# Patient Record
Sex: Female | Born: 1990 | Race: White | Hispanic: No | Marital: Single | Smoking: Never smoker
Health system: Southern US, Community
[De-identification: ages and names within clinical notes are randomized; demographics above are authoritative.]

## PROBLEM LIST (undated history)

## (undated) DIAGNOSIS — S43006A Unspecified dislocation of unspecified shoulder joint, initial encounter: Secondary | ICD-10-CM

## (undated) DIAGNOSIS — S43005A Unspecified dislocation of left shoulder joint, initial encounter: Secondary | ICD-10-CM

## (undated) HISTORY — PX: TYMPANOSTOMY TUBE PLACEMENT: SHX32

## (undated) HISTORY — PX: ADENOIDECTOMY: SUR15

## (undated) HISTORY — PX: SHOULDER SURGERY: SHX246

---
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
## 2009-03-08 ENCOUNTER — Ambulatory Visit: Payer: Self-pay | Admitting: Radiology

## 2009-03-08 ENCOUNTER — Emergency Department (HOSPITAL_BASED_OUTPATIENT_CLINIC_OR_DEPARTMENT_OTHER): Admission: EM | Admit: 2009-03-08 | Discharge: 2009-03-08 | Payer: Self-pay | Admitting: Emergency Medicine

## 2009-04-12 ENCOUNTER — Emergency Department (HOSPITAL_COMMUNITY): Admission: EM | Admit: 2009-04-12 | Discharge: 2009-04-12 | Payer: Self-pay | Admitting: Emergency Medicine

## 2012-03-05 ENCOUNTER — Encounter (HOSPITAL_BASED_OUTPATIENT_CLINIC_OR_DEPARTMENT_OTHER): Payer: Self-pay | Admitting: *Deleted

## 2012-03-05 ENCOUNTER — Emergency Department (INDEPENDENT_AMBULATORY_CARE_PROVIDER_SITE_OTHER): Payer: BC Managed Care – PPO

## 2012-03-05 ENCOUNTER — Emergency Department (HOSPITAL_BASED_OUTPATIENT_CLINIC_OR_DEPARTMENT_OTHER)
Admission: EM | Admit: 2012-03-05 | Discharge: 2012-03-05 | Disposition: A | Discharge status: Home / Self Care | Payer: BC Managed Care – PPO | Attending: Emergency Medicine | Admitting: Emergency Medicine

## 2012-03-05 DIAGNOSIS — Z09 Encounter for follow-up examination after completed treatment for conditions other than malignant neoplasm: Secondary | ICD-10-CM

## 2012-03-05 DIAGNOSIS — X58XXXA Exposure to other specified factors, initial encounter: Secondary | ICD-10-CM

## 2012-03-05 DIAGNOSIS — S43016A Anterior dislocation of unspecified humerus, initial encounter: Secondary | ICD-10-CM | POA: Insufficient documentation

## 2012-03-05 DIAGNOSIS — S43015A Anterior dislocation of left humerus, initial encounter: Secondary | ICD-10-CM

## 2012-03-05 DIAGNOSIS — Y93B3 Activity, free weights: Secondary | ICD-10-CM | POA: Insufficient documentation

## 2012-03-05 DIAGNOSIS — X503XXA Overexertion from repetitive movements, initial encounter: Secondary | ICD-10-CM | POA: Insufficient documentation

## 2012-03-05 HISTORY — DX: Unspecified dislocation of unspecified shoulder joint, initial encounter: S43.006A

## 2012-03-05 MED ORDER — FENTANYL CITRATE 0.05 MG/ML IJ SOLN
INTRAMUSCULAR | Status: AC
  Filled 2012-03-05: qty 2

## 2012-03-05 MED ORDER — OXYCODONE-ACETAMINOPHEN 5-325 MG PO TABS: 2.0000 | ORAL_TABLET | Freq: Four times a day (QID) | ORAL | Status: AC | PRN

## 2012-03-05 MED ORDER — ONDANSETRON HCL 4 MG/2ML IJ SOLN
INTRAMUSCULAR | Status: AC
  Administered 2012-03-05: 4 mg
  Filled 2012-03-05: qty 2

## 2012-03-05 MED ORDER — PROPOFOL 10 MG/ML IV BOLUS
60.0000 mg | Freq: Once | INTRAVENOUS | Status: AC
  Administered 2012-03-05: 30 mg via INTRAVENOUS
  Filled 2012-03-05: qty 6

## 2012-03-05 MED ORDER — MIDAZOLAM HCL 2 MG/2ML IJ SOLN
INTRAMUSCULAR | Status: AC
  Filled 2012-03-05: qty 2

## 2012-03-05 MED ORDER — HYDROMORPHONE HCL PF 1 MG/ML IJ SOLN
INTRAMUSCULAR | Status: AC
  Administered 2012-03-05: 1 mg
  Filled 2012-03-05: qty 1

## 2012-03-05 MED ORDER — IBUPROFEN 800 MG PO TABS: 800.0000 mg | ORAL_TABLET | Freq: Three times a day (TID) | ORAL | Status: AC

## 2012-03-05 MED ORDER — PROPOFOL 10 MG/ML IV EMUL
INTRAVENOUS | Status: AC
  Administered 2012-03-05: 30 mg via INTRAVENOUS
  Filled 2012-03-05: qty 20

## 2012-03-05 NOTE — Discharge Instructions (Signed)

## 2012-03-05 NOTE — ED Notes (Signed)
1mg   Midazolam &  75 mcg fentanyl given per MD order , patient alert prior to administration, VSS, scanner not functioning

## 2012-03-05 NOTE — ED Notes (Signed)
Additional 30 mg propofol given per MD verbal order, patient awake and alert prior to med, scanner not working

## 2012-03-05 NOTE — ED Notes (Signed)
50mg  propofol given per MD order, VSS, patient responding to questions, scanner not available

## 2012-03-05 NOTE — ED Notes (Signed)
75 mcg fentanyl given per MD order, VSS, scanner not functioning

## 2012-03-05 NOTE — ED Notes (Signed)
1mg  versed given per MD order, vss, scanner not available

## 2012-03-05 NOTE — ED Notes (Signed)
30 mg propofol given per MD order, scanner not functioning, patient alert prior to med administration, VSS

## 2012-03-05 NOTE — ED Notes (Signed)
1mg  midazolam given per MD order, scanner not functioning, vss

## 2012-03-05 NOTE — ED Notes (Signed)
MD procedure for shoulder complete

## 2012-03-05 NOTE — ED Notes (Signed)
Additional 30mg  propofol given per MD order, scanner not functioning, patient alert and oriented prior to administration

## 2012-03-05 NOTE — ED Notes (Signed)
Additional 30mg  propofol given per MD order, scanner not available, VSS, patient alert prior to administration

## 2012-03-05 NOTE — ED Notes (Signed)
Patient on room air, no respiratory distress

## 2012-03-05 NOTE — ED Provider Notes (Signed)
History     CSN: [id number]  Arrival date & time 03/05/12  1241   First MD Initiated Contact with Patient 03/05/12 1246      Chief Complaint  Patient presents with  . 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 Patient has obvious deformity of the left shoulder similar to when she's had prior shoulder dislocations. Patient has always been able to be reduced in the emergency department. She denies any other injuries. Any movement or palpation makes the pain worse. She has no neurologic symptoms in the left upper [place] is neurovascularly intact.There are no other associated or modifying factors.  Past Medical History  Diagnosis Date  . Shoulder dislocation     bilateral    Past Surgical History  Procedure Date  . Shoulder surgery     2008  . Tonsillectomy and adenoidectomy   . Tympanostomy tube placement     History reviewed. No pertinent family history.  History  Substance Use Topics  . Smoking status: Never Smoker   . Smokeless tobacco: Not on file  . Alcohol Use: No    OB History    Grav Para Term Preterm Abortions TAB SAB Ect Mult Living                  Review of Systems  Constitutional: Negative.   HENT: Negative.   Eyes: Negative.   Respiratory: Negative.   Cardiovascular: Negative.   Gastrointestinal: Negative.   Genitourinary: Negative.   Musculoskeletal:       Left shoulder pain  Skin: Negative.   Neurological: Negative.   Hematological: Negative.   Psychiatric/Behavioral: Negative.   All other systems reviewed and are negative.    Allergies  Review of patient's allergies indicates not on file.  Home Medications  No current outpatient prescriptions on file.  There were no vitals taken for  this visit.  Physical Exam  Nursing note and vitals reviewed. GEN: Well-developed, well-nourished female in significant distress due to pain HEENT: Atraumatic, normocephalic. Oropharynx clear without erythema EYES: PERRLA BL, no scleral icterus. NECK: Trachea midline, no meningismus CV: Tachycardic with regular rhythm. No murmurs, rubs, or gallops PULM: No respiratory distress.  No crackles, wheezes, or rales. GI: soft, non-tender. No guarding, rebound, or tenderness. + bowel sounds  GU: deferred Neuro: cranial nerves 2-12 intact, no abnormalities of strength or sensation, A and O x 3 MSK: Left upper extremity for bracing of the body. There is deformity of the contours of the left shoulder consistent with anterior shoulder dislocation. Left upper extremity is neurovascularly intact with 2+ radial pulse.  Skin: No rashes petechiae, purpura, or jaundice Psych: no abnormality of mood   ED Course  Reduction of dislocation Performed by: Cyndra Numbers Authorized by: Cyndra Numbers Consent: Verbal consent obtained. Written consent obtained. Risks and benefits: risks, benefits and alternatives were discussed Consent given by: patient Patient understanding: patient states understanding of the procedure being performed Patient consent: the patient's understanding of the procedure matches consent given Procedure consent: procedure consent matches procedure scheduled Relevant documents: relevant documents present and verified Test results: test results available and properly labeled Site marked: the operative site was marked Imaging studies: imaging studies available Required items: required blood products, implants, devices, and special equipment available Patient identity confirmed: verbally with patient and arm band Time out:  Immediately prior to procedure a "time out" was called to verify the correct patient, procedure, equipment, support staff and site/side marked as required. Local  anesthesia used: no Patient sedated: yes Sedation type: moderate (conscious) sedation Sedatives: see MAR for details Patient tolerance: Patient tolerated the procedure well with no immediate complications. Comments: Left shoulder anterior dislocation reduction was performed   (including critical care time)  Procedural sedation Performed by: Cyndra Numbers Consent: Verbal consent obtained. Risks and benefits: risks, benefits and alternatives were discussed Required items: required blood products, implants, devices, and special equipment available Patient identity confirmed: arm band and provided demographic data Time out: Immediately prior to procedure a "time out" was called to verify the correct patient, procedure, equipment, support staff and site/side marked as required.  Sedation type: moderate (conscious) sedation NPO time confirmed and considedered  Sedatives: PROPOFOL  Physician Time at Bedside: 30  Of note, the patient did require additional medications including for milligrams of Versed, and 200 mcg of sentinel. Patient was conscious throughout the procedure but was able to tolerate shoulder reduction without significant discomfort  Vitals: Vital signs were monitored during sedation. Cardiac Monitor, pulse oximeter Patient tolerance: Patient tolerated the procedure well with no immediate complications. Comments: Pt with uneventful recovered. Returned to pre-procedural sedation baseline    Labs Reviewed - No data to display Dg Shoulder Left  03/05/2012  *RADIOLOGY REPORT*  Clinical Data: Probable left shoulder dislocation  LEFT SHOULDER - 2+ VIEW  Comparison: report 05/29/2003 no images available.  Findings: Two views of the left shoulder submitted.  There is anterior inferior subluxation of the left humeral head from glenohumeral joint.  IMPRESSION: Anterior inferior left shoulder dislocation.  Original Report Authenticated By: Natasha Mead, M.D.   Dg Shoulder Left  Port  03/05/2012  *RADIOLOGY REPORT*  Clinical Data: Status post reduction of dislocation.  PORTABLE LEFT SHOULDER - 2+ VIEW  Comparison: Plain films earlier the same date.  Findings: Anterior dislocation has been reduced.  No new abnormality.  IMPRESSION: Successful reduction of dislocation.  Original Report Authenticated By: Bernadene Bell. D'ALESSIO, M.D.     1. Dislocation of shoulder, anterior, left, closed       MDM  Patient presented with obvious left shoulder dislocation. Initial film was performed with no evidence of fracture. Conscious sedation and reduction were performed successfully. Please see above notes for details. Patient returned to baseline within 30 minutes. She was able to be discharged. She will followup with her prior orthopedist. Patient was discharged in sling in good condition.Marland Kitchen She was given prescriptions for pain medications and precautions for reasons to return.        Cyndra Numbers, MD 03/05/12 1757

## 2012-03-05 NOTE — ED Notes (Signed)
Patient placed on 2l [place] oxygen at start of procedure

## 2012-03-05 NOTE — ED Notes (Signed)
Working out at gym dislocated left shoulder states has history of same several times

## 2012-03-05 NOTE — ED Notes (Signed)
1mg  versed given per MD order, VSS, scanner not available

## 2012-03-09 ENCOUNTER — Other Ambulatory Visit: Payer: Self-pay | Admitting: Orthopedic Surgery

## 2012-03-09 DIAGNOSIS — M25512 Pain in left shoulder: Secondary | ICD-10-CM

## 2012-03-16 ENCOUNTER — Ambulatory Visit
Admission: RE | Admit: 2012-03-16 | Discharge: 2012-03-16 | Disposition: A | Discharge status: Home / Self Care | Payer: BC Managed Care – PPO | Source: Ambulatory Visit | Attending: Orthopedic Surgery | Admitting: Orthopedic Surgery

## 2012-03-16 DIAGNOSIS — M25512 Pain in left shoulder: Secondary | ICD-10-CM

## 2012-03-16 MED ORDER — IOHEXOL 180 MG/ML  SOLN
5.0000 mL | Freq: Once | INTRAMUSCULAR | Status: AC | PRN
  Administered 2012-03-16: 5 mL via INTRA_ARTICULAR

## 2012-03-20 ENCOUNTER — Other Ambulatory Visit: Payer: Self-pay | Admitting: Orthopedic Surgery

## 2012-03-23 ENCOUNTER — Encounter (HOSPITAL_BASED_OUTPATIENT_CLINIC_OR_DEPARTMENT_OTHER): Payer: Self-pay | Admitting: *Deleted

## 2012-03-23 NOTE — Progress Notes (Signed)
Bring all medications

## 2012-03-27 ENCOUNTER — Encounter (HOSPITAL_BASED_OUTPATIENT_CLINIC_OR_DEPARTMENT_OTHER): Payer: Self-pay | Admitting: Orthopedic Surgery

## 2012-03-27 ENCOUNTER — Encounter (HOSPITAL_BASED_OUTPATIENT_CLINIC_OR_DEPARTMENT_OTHER): Payer: Self-pay | Admitting: Certified Registered Nurse Anesthetist

## 2012-03-27 ENCOUNTER — Encounter (HOSPITAL_BASED_OUTPATIENT_CLINIC_OR_DEPARTMENT_OTHER)
Admission: RE | Disposition: A | Discharge status: Home / Self Care | Payer: Self-pay | Source: Ambulatory Visit | Attending: Orthopedic Surgery

## 2012-03-27 ENCOUNTER — Ambulatory Visit (HOSPITAL_BASED_OUTPATIENT_CLINIC_OR_DEPARTMENT_OTHER): Payer: BC Managed Care – PPO | Admitting: Certified Registered Nurse Anesthetist

## 2012-03-27 ENCOUNTER — Encounter (HOSPITAL_BASED_OUTPATIENT_CLINIC_OR_DEPARTMENT_OTHER): Payer: Self-pay

## 2012-03-27 ENCOUNTER — Ambulatory Visit (HOSPITAL_BASED_OUTPATIENT_CLINIC_OR_DEPARTMENT_OTHER)
Admission: RE | Admit: 2012-03-27 | Discharge: 2012-03-27 | Disposition: A | Discharge status: Home / Self Care | Payer: BC Managed Care – PPO | Source: Ambulatory Visit | Attending: Orthopedic Surgery | Admitting: Orthopedic Surgery

## 2012-03-27 DIAGNOSIS — S43005A Unspecified dislocation of left shoulder joint, initial encounter: Secondary | ICD-10-CM | POA: Diagnosis present

## 2012-03-27 DIAGNOSIS — M24419 Recurrent dislocation, unspecified shoulder: Secondary | ICD-10-CM | POA: Insufficient documentation

## 2012-03-27 HISTORY — DX: Unspecified dislocation of left shoulder joint, initial encounter: S43.005A

## 2012-03-27 SURGERY — SHOULDER ARTHROSCOPY WITH BANKART REPAIR
Anesthesia: General | Site: Shoulder | Laterality: Left | Wound class: Clean

## 2012-03-27 MED ORDER — METHOCARBAMOL 500 MG PO TABS
500.0000 mg | ORAL_TABLET | Freq: Once | ORAL | Status: AC | PRN
  Administered 2012-03-27: 500 mg via ORAL

## 2012-03-27 MED ORDER — OXYCODONE-ACETAMINOPHEN 5-325 MG PO TABS
1.0000 | ORAL_TABLET | Freq: Once | ORAL | Status: AC | PRN
  Administered 2012-03-27: 2 via ORAL

## 2012-03-27 MED ORDER — METOCLOPRAMIDE HCL 5 MG/ML IJ SOLN: 10.0000 mg | Freq: Once | INTRAMUSCULAR | Status: DC | PRN

## 2012-03-27 MED ORDER — MIDAZOLAM HCL 2 MG/2ML IJ SOLN
0.5000 mg | INTRAMUSCULAR | Status: DC | PRN
  Administered 2012-03-27: 2 mg via INTRAVENOUS

## 2012-03-27 MED ORDER — PROMETHAZINE HCL 25 MG PO TABS
25.0000 mg | ORAL_TABLET | Freq: Once | ORAL | Status: AC | PRN
  Administered 2012-03-27: 25 mg via ORAL

## 2012-03-27 MED ORDER — LIDOCAINE HCL (CARDIAC) 20 MG/ML IV SOLN
INTRAVENOUS | Status: DC | PRN
  Administered 2012-03-27: 50 mg via INTRAVENOUS

## 2012-03-27 MED ORDER — HYDROMORPHONE HCL PF 1 MG/ML IJ SOLN
0.2500 mg | INTRAMUSCULAR | Status: DC | PRN
  Administered 2012-03-27 (×3): 0.5 mg via INTRAVENOUS

## 2012-03-27 MED ORDER — SODIUM CHLORIDE 0.9 % IR SOLN
Status: DC | PRN
  Administered 2012-03-27: 12000 mL

## 2012-03-27 MED ORDER — FENTANYL CITRATE 0.05 MG/ML IJ SOLN
25.0000 ug | INTRAMUSCULAR | Status: DC | PRN
  Administered 2012-03-27: 25 ug via INTRAVENOUS

## 2012-03-27 MED ORDER — PROMETHAZINE HCL 25 MG PO TABS: 25.0000 mg | ORAL_TABLET | Freq: Four times a day (QID) | ORAL | Status: AC | PRN

## 2012-03-27 MED ORDER — PROPOFOL 10 MG/ML IV EMUL
INTRAVENOUS | Status: DC | PRN
  Administered 2012-03-27: 200 mg via INTRAVENOUS

## 2012-03-27 MED ORDER — MIDAZOLAM HCL 5 MG/5ML IJ SOLN
INTRAMUSCULAR | Status: DC | PRN
  Administered 2012-03-27: 1 mg via INTRAVENOUS

## 2012-03-27 MED ORDER — CEFAZOLIN SODIUM 1-5 GM-% IV SOLN
1.0000 g | INTRAVENOUS | Status: AC
  Administered 2012-03-27: 1 g via INTRAVENOUS

## 2012-03-27 MED ORDER — ROPIVACAINE HCL 5 MG/ML IJ SOLN
INTRAMUSCULAR | Status: DC | PRN
  Administered 2012-03-27: 15 mL via EPIDURAL

## 2012-03-27 MED ORDER — LIDOCAINE HCL 1 % IJ SOLN
INTRAMUSCULAR | Status: DC | PRN
  Administered 2012-03-27: 2 mL via INTRADERMAL

## 2012-03-27 MED ORDER — ONDANSETRON HCL 4 MG/2ML IJ SOLN
INTRAMUSCULAR | Status: DC | PRN
  Administered 2012-03-27: 4 mg via INTRAVENOUS

## 2012-03-27 MED ORDER — LACTATED RINGERS IV SOLN
INTRAVENOUS | Status: DC
  Administered 2012-03-27 (×3): via INTRAVENOUS

## 2012-03-27 MED ORDER — OXYCODONE-ACETAMINOPHEN 10-325 MG PO TABS: 1.0000 | ORAL_TABLET | Freq: Four times a day (QID) | ORAL | Status: AC | PRN

## 2012-03-27 MED ORDER — FENTANYL CITRATE 0.05 MG/ML IJ SOLN
50.0000 ug | INTRAMUSCULAR | Status: DC | PRN
  Administered 2012-03-27: 100 ug via INTRAVENOUS

## 2012-03-27 MED ORDER — DEXAMETHASONE SODIUM PHOSPHATE 4 MG/ML IJ SOLN
INTRAMUSCULAR | Status: DC | PRN
  Administered 2012-03-27: 10 mg via INTRAVENOUS

## 2012-03-27 MED ORDER — METHOCARBAMOL 500 MG PO TABS: 500.0000 mg | ORAL_TABLET | Freq: Four times a day (QID) | ORAL | Status: AC

## 2012-03-27 SURGICAL SUPPLY — 73 items
ANCHOR PUSHLOCK BIOCOMP 2.9X15 (Orthopedic Implant) ×4 IMPLANT
BENZOIN TINCTURE PRP APPL 2/3 (GAUZE/BANDAGES/DRESSINGS) ×2 IMPLANT
BLADE 4.2CUDA (BLADE) IMPLANT
BLADE CUDA GRT WHITE 3.5 (BLADE) IMPLANT
BLADE CUTTER GATOR 3.5 (BLADE) ×2 IMPLANT
BLADE GREAT WHITE 4.2 (BLADE) IMPLANT
BLADE SURG 15 STRL LF DISP TIS (BLADE) ×1 IMPLANT
BLADE SURG 15 STRL SS (BLADE) ×1
BUR OVAL 6.0 (BURR) IMPLANT
CANISTER OMNI JUG 16 LITER (MISCELLANEOUS) IMPLANT
CANNULA 5.75X71 LONG (CANNULA) ×2 IMPLANT
CANNULA ACUFLEX KIT 5X76 (CANNULA) IMPLANT
CANNULA TWIST IN 8.25X7CM (CANNULA) ×2 IMPLANT
CANNULA TWIST IN 8.25X9CM (CANNULA) IMPLANT
CLOTH BEACON ORANGE TIMEOUT ST (SAFETY) ×2 IMPLANT
CUTTER MENISCUS  4.2MM (BLADE)
CUTTER MENISCUS 4.2MM (BLADE) IMPLANT
DECANTER SPIKE VIAL GLASS SM (MISCELLANEOUS) IMPLANT
DRAPE INCISE IOBAN 66X45 STRL (DRAPES) ×2 IMPLANT
DRAPE SHOULDER BEACH CHAIR (DRAPES) ×2 IMPLANT
DRAPE U 20/CS (DRAPES) ×4 IMPLANT
DRAPE U-SHAPE 47X51 STRL (DRAPES) ×2 IMPLANT
DRAPE UTILITY W/TAPE 26X15 (DRAPES) ×2 IMPLANT
DRSG PAD ABDOMINAL 8X10 ST (GAUZE/BANDAGES/DRESSINGS) ×4 IMPLANT
DURAPREP 26ML APPLICATOR (WOUND CARE) ×2 IMPLANT
ELECT REM PT RETURN 9FT ADLT (ELECTROSURGICAL)
ELECTRODE REM PT RTRN 9FT ADLT (ELECTROSURGICAL) IMPLANT
FIBERSTICK 2 (SUTURE) ×4 IMPLANT
GLOVE BIO SURGEON STRL SZ 6.5 (GLOVE) ×2 IMPLANT
GLOVE BIO SURGEON STRL SZ7.5 (GLOVE) ×2 IMPLANT
GLOVE BIO SURGEON STRL SZ8 (GLOVE) ×2 IMPLANT
GLOVE BIOGEL PI IND STRL 7.0 (GLOVE) ×1 IMPLANT
GLOVE BIOGEL PI IND STRL 7.5 (GLOVE) ×1 IMPLANT
GLOVE BIOGEL PI IND STRL 8 (GLOVE) ×2 IMPLANT
GLOVE BIOGEL PI INDICATOR 7.0 (GLOVE) ×1
GLOVE BIOGEL PI INDICATOR 7.5 (GLOVE) ×1
GLOVE BIOGEL PI INDICATOR 8 (GLOVE) ×2
GLOVE ORTHO TXT STRL SZ7.5 (GLOVE) ×2 IMPLANT
GOWN PREVENTION PLUS XLARGE (GOWN DISPOSABLE) ×4 IMPLANT
GOWN PREVENTION PLUS XXLARGE (GOWN DISPOSABLE) ×4 IMPLANT
IMMOBILIZER SHOULDER XLGE (ORTHOPEDIC SUPPLIES) IMPLANT
KIT DISPOSABLE PUSHLOCK 2.9MM (KITS) ×2 IMPLANT
KIT SHOULDER TRACTION (DRAPES) ×2 IMPLANT
LASSO SUT 90 DEGREE (SUTURE) IMPLANT
LOOP 2 FIBERLINK CLOSED (SUTURE) IMPLANT
PACK ARTHROSCOPY DSU (CUSTOM PROCEDURE TRAY) ×2 IMPLANT
PACK BASIN DAY SURGERY FS (CUSTOM PROCEDURE TRAY) ×2 IMPLANT
SET ARTHROSCOPY TUBING (MISCELLANEOUS) ×1
SET ARTHROSCOPY TUBING LN (MISCELLANEOUS) ×1 IMPLANT
SHEET MEDIUM DRAPE 40X70 STRL (DRAPES) ×2 IMPLANT
SLEEVE SCD COMPRESS KNEE MED (MISCELLANEOUS) ×2 IMPLANT
SLING ARM FOAM STRAP LRG (SOFTGOODS) IMPLANT
SLING ARM FOAM STRAP MED (SOFTGOODS) IMPLANT
SLING ARM FOAM STRAP XLG (SOFTGOODS) IMPLANT
SLING ARM IMMOBILIZER LRG (SOFTGOODS) ×2 IMPLANT
SLING ARM IMMOBILIZER MED (SOFTGOODS) IMPLANT
SPONGE GAUZE 4X4 12PLY (GAUZE/BANDAGES/DRESSINGS) ×2 IMPLANT
STRIP CLOSURE SKIN 1/2X4 (GAUZE/BANDAGES/DRESSINGS) ×2 IMPLANT
SUT FIBERWIRE #2 38 T-5 BLUE (SUTURE)
SUT LASSO 45 DEGREE (SUTURE) IMPLANT
SUT LASSO 45 DEGREE LEFT (SUTURE) ×2 IMPLANT
SUT LASSO 45D RIGHT (SUTURE) IMPLANT
SUT MNCRL AB 4-0 PS2 18 (SUTURE) IMPLANT
SUT PDS AB 1 CT  36 (SUTURE)
SUT PDS AB 1 CT 36 (SUTURE) IMPLANT
SUT TIGER TAPE 7 IN WHITE (SUTURE) IMPLANT
SUT VIC AB 3-0 SH 27 (SUTURE)
SUT VIC AB 3-0 SH 27X BRD (SUTURE) IMPLANT
SUTURE FIBERWR #2 38 T-5 BLUE (SUTURE) IMPLANT
TOWEL OR 17X24 6PK STRL BLUE (TOWEL DISPOSABLE) ×4 IMPLANT
TUBE CONNECTING 20X1/4 (TUBING) IMPLANT
WAND STAR VAC 90 (SURGICAL WAND) ×2 IMPLANT
WATER STERILE IRR 1000ML POUR (IV SOLUTION) ×2 IMPLANT

## 2012-03-27 NOTE — Discharge Instructions (Signed)
Shoulder Arthroscopy Because the shoulder is one of the most mobile joints, it is more prone to injury. It is a very shallow ball and socket joint located between the large bone in your upper arm (humerus) and the shoulder blade (scapula). Arthroscopy is a valuable test for evaluating and treating injuries involving the shoulder joint. Arthroscopy is a surgical technique which uses small incisions (cuts by the surgeon) to insert a small telescope like instrument (arthroscope) and other tools into the shoulder. This allows the surgeon to look directly at the problem. When the arthroscope is in the joint, fluid is used to expand the joint space. This allows the surgeon to examine it more easily. The arthroscope then beams light into the joint and sends an image to a TV screen. As your surgeon examines your shoulder, he or she can also repair a number of problems found at the same time. Sometimes the procedure may change to an open surgery. This would happen if the problems are severe enough that they cannot be corrected with just arthroscopy. This is usually a very safe surgery. Rare complications include damage to nerves or blood vessels, excess bleeding, blood clots, infection, and rarely instrument failure. This is most often performed as a same day surgery. This means you will not have to stay in the hospital overnight. Recovery from this surgery is also much faster than having an open procedure. LET YOUR CAREGIVER KNOW ABOUT:  Allergies.   Medications taken including herbs, eye drops, over the counter medications, and creams.   Use of steroids (by mouth or creams).   Previous problems with anesthetics or novocaine.   Possibility of pregnancy, if this applies.   History of blood clots (thrombophlebitis).   History of bleeding or blood problems.   Previous surgery.   Other health problems.   Family history of anesthetic problems.  BEFORE THE PROCEDURE   Stop all anti-inflammatory  medications at least one week before surgery unless instructed otherwise. Tell your surgeon if you have been taking cortisone or other steroids.   Do not eat or drink after midnight or as instructed. Take medications as directed by your caregiver. You may have lab tests the morning of surgery.   You should be present 60 minutes prior to your procedure or as directed.  PROCEDURE  You may have general (go to sleep) or local (numb the area) anesthetic. Your surgeon will discuss this with you. During the procedure as discussed above, your surgeon may find a variety of problems which he or she can improve or correct using small instruments. When the procedure is finished the tiny incisions will be closed with stitches or tape. AFTER YOUR PROCEDURE  After surgery you will be taken to the recovery area. A nurse will watch and check your progress. Once you are awake, stable, and taking fluids well, barring other problems you will be allowed to go home.   Once home, apply an ice pack to your operative site for twenty minutes, three to four times per day, for two to three days. This may help with discomfort and keep the swelling down.   Use a sling and medications if prescribed or as instructed.   Unless your caregiver advises otherwise, move your arm and shoulder gently and frequently following the procedure. This can help prevent stiffness and swelling.  REHABILITATION  Almost as important as your surgery is your rehabilitation. If physical therapy and exercises are prescribed by your surgeon, follow them diligently. Once comfortable and on your way   to full use, do muscle strengthening exercises as instructed.   Only take over-the-counter or prescription medicines for pain, discomfort, or fever as directed by your caregiver.  SEEK IMMEDIATE MEDICAL CARE IF:   There is redness, swelling, or increasing pain in the wound or joint.   You notice purulent (colored- pus-like) drainage coming from the  wound.   An unexplained oral temperature above 102 F (38.9 C) develops.   You notice a foul smell coming from the wound or dressing.   There is a breaking open of the wound. The edges do not stay together after sutures or tape has been removed.   Persistent bleeding from the small incision.  Document Released: 11/22/2000 Document Revised: 11/14/2011 Document Reviewed: 03/13/2009 [employer] Patient Information 2012 [employer].   Post Anesthesia Home Care Instructions  Activity: Get plenty of rest for the remainder of the day. A responsible adult should stay with you for 24 hours following the procedure.  For the next 24 hours, DO NOT: -Drive a car -Operate machinery -Drink alcoholic beverages -Take any medication unless instructed by your physician -Make any legal decisions or sign important papers.  Meals: Start with liquid foods such as gelatin or soup. Progress to regular foods as tolerated. Avoid greasy, spicy, heavy foods. If nausea and/or vomiting occur, drink only clear liquids until the nausea and/or vomiting subsides. Call your physician if vomiting continues.  Special Instructions/Symptoms: Your throat may feel dry or sore from the anesthesia or the breathing tube placed in your throat during surgery. If this causes discomfort, gargle with warm salt water. The discomfort should disappear within 24 hours.  Regional Anesthesia Blocks  1. Numbness or the inability to move the "blocked" extremity may last from 3-48 hours after placement. The length of time depends on the medication injected and your individual response to the medication. If the numbness is not going away after 48 hours, call your surgeon.  2. The extremity that is blocked will need to be protected until the numbness is gone and the  Strength has returned. Because you cannot feel it, you will need to take extra care to avoid injury. Because it may be weak, you may have difficulty moving it or using it. You  may not know what position it is in without looking at it while the block is in effect.  3. For blocks in the legs and feet, returning to weight bearing and walking needs to be done carefully. You will need to wait until the numbness is entirely gone and the strength has returned. You should be able to move your leg and foot normally before you try and bear weight or walk. You will need someone to be with you when you first try to ensure you do not fall and possibly risk injury.  4. Bruising and tenderness at the needle site are common side effects and will resolve in a few days.  5. Persistent numbness or new problems with movement should be communicated to the surgeon or the [facility] ([phone])/ [facility] ([phone]).  

## 2012-03-27 NOTE — Anesthesia Preprocedure Evaluation (Signed)
Anesthesia Evaluation  Patient identified by MRN, date of birth, ID band Patient awake    Reviewed: Allergy & Precautions, H&P , NPO status , Patient's Chart, lab work & pertinent test results, reviewed documented beta blocker date and time   Airway Mallampati: II TM Distance: >3 FB Neck ROM: full    Dental   Pulmonary neg pulmonary ROS,          Cardiovascular negative cardio ROS      Neuro/Psych negative neurological ROS  negative psych ROS   GI/Hepatic negative GI ROS, Neg liver ROS,   Endo/Other  negative endocrine ROS  Renal/GU negative Renal ROS  negative genitourinary   Musculoskeletal   Abdominal   Peds  Hematology negative hematology ROS (+)   Anesthesia Other Findings See surgeon's H&P   Reproductive/Obstetrics negative OB ROS                           Anesthesia Physical Anesthesia Plan  ASA: I  Anesthesia Plan: General   Post-op Pain Management:    Induction: Intravenous  Airway Management Planned: Oral ETT  Additional Equipment:   Intra-op Plan:   Post-operative Plan: Extubation in OR  Informed Consent: I have reviewed the patients History and Physical, chart, labs and discussed the procedure including the risks, benefits and alternatives for the proposed anesthesia with the patient or authorized representative who has indicated his/her understanding and acceptance.     Plan Discussed with: CRNA and Surgeon  Anesthesia Plan Comments:         Anesthesia Quick Evaluation  

## 2012-03-27 NOTE — Transfer of Care (Signed)
Immediate Anesthesia Transfer of Care Note  Patient: Katrina Heath  Procedure(s) Performed: Procedure(s) (LRB): SHOULDER ARTHROSCOPY WITH Nira Conn REPAIR (Left)  Patient Location: PACU  Anesthesia Type: General and Regional  Level of Consciousness: awake, alert , oriented and patient cooperative  Airway & Oxygen Therapy: Patient Spontanous Breathing and Patient connected to face mask oxygen  Post-op Assessment: Report given to PACU RN and Post -op Vital signs reviewed and stable  Post vital signs: Reviewed and stable  Complications: No apparent anesthesia complications

## 2012-03-27 NOTE — Op Note (Signed)
03/27/2012  10:07 AM  PATIENT:  Katrina Heath    PRE-OPERATIVE DIAGNOSIS:  left shoulder dislocation  POST-OPERATIVE DIAGNOSIS:  Same  PROCEDURE:  SHOULDER ARTHROSCOPY WITH ANTERIOR BANKART CAPSULORRHAPHY RECONSTRUCTION  SURGEON:  Eulas Post, MD  PHYSICIAN ASSISTANT: Janace Litten, OPA-C, present and scrubbed throughout the case, critical for completion in a timely fashion, instrumentation, and closure.  ANESTHESIA:   General  PREOPERATIVE INDICATIONS:  Katrina Heath is a  21 y.o. female with a diagnosis of left shoulder dislocation who failed conservative measures and elected for surgical management.  She has had a previous right shoulder dislocation that required surgical intervention. She is an avid weight lifter, and is not able to continue with her sports and do to the instability in her left shoulder.  The risks benefits and alternatives were discussed with the patient preoperatively including but not limited to the risks of infection, bleeding, nerve injury, cardiopulmonary complications, the need for revision surgery, among others, and the patient was willing to proceed. We also did discuss the risks of recurrent instability, chondrolysis, stiffness, posttraumatic arthritis, among others.  OPERATIVE IMPLANTS: Arthrex bio composite 2.9 mm push lock anchors x2  OPERATIVE FINDINGS: Anterior instability, no evidence for posterior labral tear, positive anterior Bankart. She was unstable during examination under anesthesia, anteriorly, but not posteriorly. The rotator cuff was intact. Biceps tendon was intact. There was no superior labral tear. The glenohumeral articular cartilage was in reasonably good condition. There was minimal bone loss anteriorly on the glenoid.  OPERATIVE PROCEDURE: The patient was brought to the operating room and placed in supine position. General anesthesia was administered. IV antibiotics had been given. Regional block also been given. Exam under  anesthesia was performed. The above-named findings were noted. She was turned into the lateral decubitus position, and the left upper extremity was prepped and draped in usual sterile fashion. There was a small to moderate sized Hill-Sachs lesion.  Diagnostic arthroscopy was carried out with the above-named findings. A anterior superior cannula as well as an anteroinferior cannula was placed. I placed a total of 2 horizontal mattress sutures using #2 FiberWire, the first one being very low, capturing the anterior inferior glenohumeral ligament. This was advanced superiorly, as well as laterally, up onto the face of the glenoid. This restored anterior capsular stability, and had no prominence of the anchor, and excellent imbrication and restoration of anatomy. I placed a second horizontal mattress stitch at the level of the subscapularis, imbricating the capsule to the labrum. The first pass with the drill skived anteriorly, and I centralized the drill slightly on the face, in order to achieve good bone. A second pass had excellent bone and the fixation of the anchor was excellent. Tissue imbrication was appropriate.  The sutures were cut, and the shoulder irrigated and the portals closed with Monocryl followed by Steri-Strips and sterile gauze. She tolerated the procedure well no complications.

## 2012-03-27 NOTE — Anesthesia Procedure Notes (Addendum)
Anesthesia Regional Block:  Interscalene brachial plexus block  Pre-Anesthetic Checklist: ,, timeout performed, Correct Patient, Correct Site, Correct Laterality, Correct Procedure, Correct Position, site marked, Risks and benefits discussed,  Surgical consent,  Pre-op evaluation,  At surgeon's request and post-op pain management  Laterality: Left  Prep: chloraprep       Needles:   Needle Type: Other   (Arrow Echogenic)   Needle Length: 9cm  Needle Gauge: 21    Additional Needles:  Procedures: ultrasound guided Interscalene brachial plexus block Narrative:  Start time: 03/27/2012 7:53 AM End time: 03/27/2012 8:00 AM Injection made incrementally with aspirations every 5 mL.  Performed by: Personally  Anesthesiologist: Aldona Lento, MD  Additional Notes: Ultrasound guidance used to: id relevant anatomy, confirm needle position, local anesthetic spread, avoidance of vascular puncture. Picture saved. No complications. Block performed personally by Janetta Hora. Gelene Mink, MD    Interscalene brachial plexus block Procedure Name: Intubation Date/Time: 03/27/2012 8:38 AM Performed by: Leilene Diprima D Pre-anesthesia Checklist: Patient identified, Emergency Drugs available, Suction available and Patient being monitored Patient Re-evaluated:Patient Re-evaluated prior to inductionOxygen Delivery Method: Circle System Utilized Preoxygenation: Pre-oxygenation with 100% oxygen Intubation Type: IV induction Ventilation: Mask ventilation without difficulty Laryngoscope Size: Mac and 3 Grade View: Grade II Tube type: Oral Tube size: 7.0 mm Number of attempts: 1 Airway Equipment and Method: stylet and oral airway Placement Confirmation: ETT inserted through vocal cords under direct vision,  positive ETCO2 and breath sounds checked- equal and bilateral Secured at: 20 cm Tube secured with: Tape Dental Injury: Teeth and Oropharynx as per pre-operative assessment

## 2012-03-27 NOTE — H&P (Signed)
  PREOPERATIVE H&P  Chief Complaint: left shoulder dislocation   HPI: Katrina Heath is a 21 y.o. female who presents for preoperative history and physical with a diagnosis of left shoulder dislocation. Symptoms are rated as moderate to severe, and have been worsening.  This is significantly impairing activities of daily living.  She has elected for surgical management. She wants to get back to heavy weightlifting. Her shoulder has completely dislocated multiple times.  Past Medical History  Diagnosis Date  . Shoulder dislocation     bilateral   Past Surgical History  Procedure Date  . Shoulder surgery     2008  . Tympanostomy tube placement   . Adenoidectomy    History   Social History  . Marital Status: Single    Spouse Name: N/A    Number of Children: N/A  . Years of Education: N/A   Social History Main Topics  . Smoking status: Never Smoker   . Smokeless tobacco: None  . Alcohol Use: No  . Drug Use: No  . Sexually Active: No   Other Topics Concern  . None   Social History Narrative  . None   History reviewed. No pertinent family history. Allergies  Allergen Reactions  . Food Diarrhea    Soy Beans    Prior to Admission medications   Medication Sig Start Date End Date Taking? Authorizing Provider  calcium-vitamin D (OSCAL WITH D) 250-125 MG-UNIT per tablet Take 1 tablet by mouth daily.   Yes Historical Provider, MD  fish oil-omega-3 fatty acids 1000 MG capsule Take 2 g by mouth daily.   Yes Historical Provider, MD  Multiple Vitamin (MULTIVITAMIN) tablet Take 1 tablet by mouth daily.   Yes Historical Provider, MD     Positive ROS: All other systems have been reviewed and were otherwise negative with the exception of those mentioned in the HPI and as above.  Physical Exam: General: Alert, no acute distress Cardiovascular: No pedal edema Respiratory: No cyanosis, no use of accessory musculature GI: No organomegaly, abdomen is soft and non-tender Skin: No  lesions in the area of chief complaint Neurologic: Sensation intact distally Psychiatric: Patient is competent for consent with normal mood and affect Lymphatic: No axillary or cervical lymphadenopathy  MUSCULOSKELETAL: Left shoulder has positive apprehension. Sensation and motor is intact throughout. Rotator cuff strength is intact.  Assessment: left shoulder dislocation or subluxation  Plan: Plan for Procedure(s):  SHOULDER ARTHROSCOPY WITH BANKART REPAIR  The risks benefits and alternatives were discussed with the patient including but not limited to the risks of nonoperative treatment, versus surgical intervention including infection, bleeding, nerve injury,  blood clots, cardiopulmonary complications, morbidity, mortality, among others, and they were willing to proceed. We've also discussed the risks of posttraumatic arthritis, recurrent instability, among others.  Eulas Post, MD 03/27/2012 7:07 AM

## 2012-03-27 NOTE — Progress Notes (Signed)
Assisted Dr. Frederick with left, ultrasound guided, supraclavicular block. Side rails up, monitors on throughout procedure. See vital signs in flow sheet. Tolerated Procedure well. 

## 2012-03-27 NOTE — Anesthesia Postprocedure Evaluation (Signed)
Anesthesia Post Note  Patient: Katrina Heath  Procedure(s) Performed: Procedure(s) (LRB): SHOULDER ARTHROSCOPY WITH BANKHARDT REPAIR (Left)  Anesthesia type: General  Patient location: PACU  Post pain: Pain level controlled  Post assessment: Patient's Cardiovascular Status Stable  Last Vitals:  Filed Vitals:   03/27/12 1145  BP: 108/53  Pulse: 70  Temp: 36.9 C  Resp: 16    Post vital signs: Reviewed and stable  Level of consciousness: alert  Complications: No apparent anesthesia complications

## 2012-08-28 ENCOUNTER — Other Ambulatory Visit: Payer: Self-pay | Admitting: Family Medicine

## 2012-08-28 DIAGNOSIS — N63 Unspecified lump in unspecified breast: Secondary | ICD-10-CM

## 2012-09-01 ENCOUNTER — Other Ambulatory Visit: Payer: BC Managed Care – PPO

## 2012-09-03 ENCOUNTER — Ambulatory Visit
Admission: RE | Admit: 2012-09-03 | Discharge: 2012-09-03 | Disposition: A | Discharge status: Home / Self Care | Payer: BC Managed Care – PPO | Source: Ambulatory Visit | Attending: Family Medicine | Admitting: Family Medicine

## 2012-09-03 DIAGNOSIS — N63 Unspecified lump in unspecified breast: Secondary | ICD-10-CM

## 2013-09-09 IMAGING — CR DG SHOULDER 1V*L*
2 series · 2 of 2 positions shown · non-contrast
Comparison: Plain films earlier the same date.

CLINICAL DATA: Status post reduction of dislocation.

PORTABLE LEFT SHOULDER - 2+ VIEW

[view not recorded (1 of 2)]
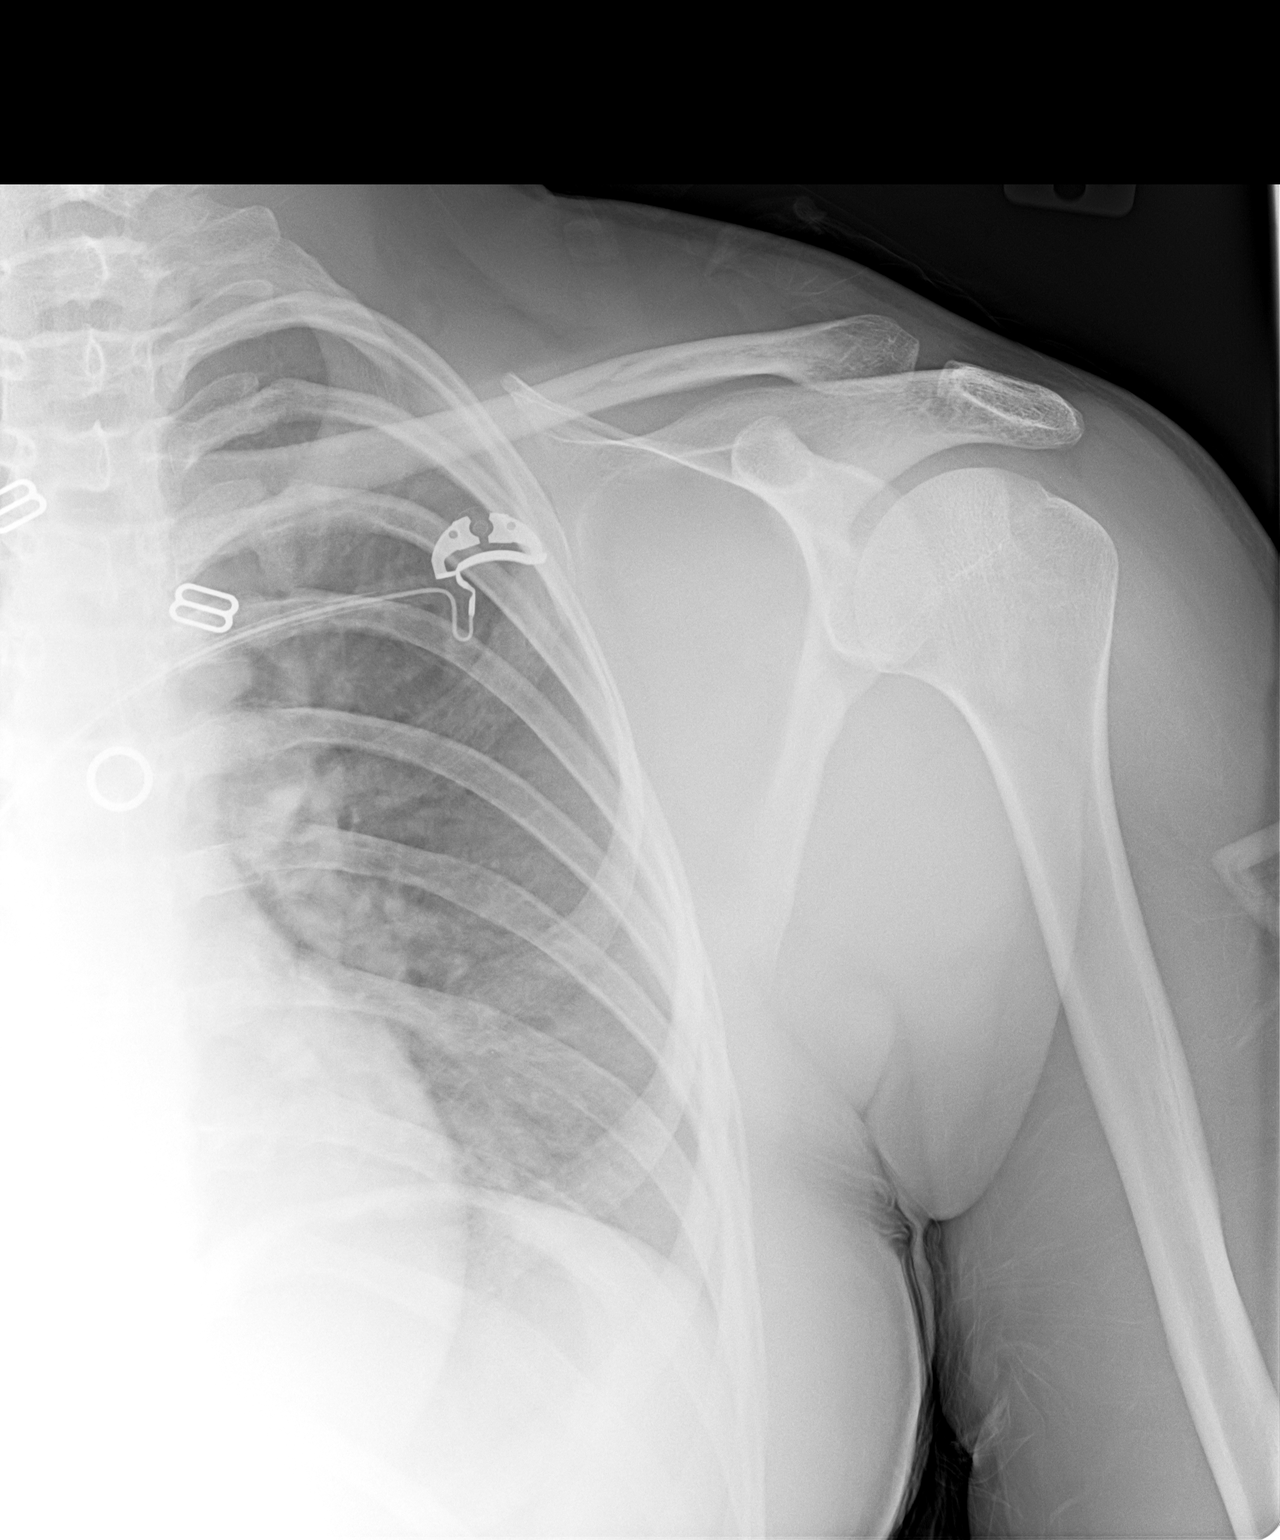

[view not recorded (2 of 2)]
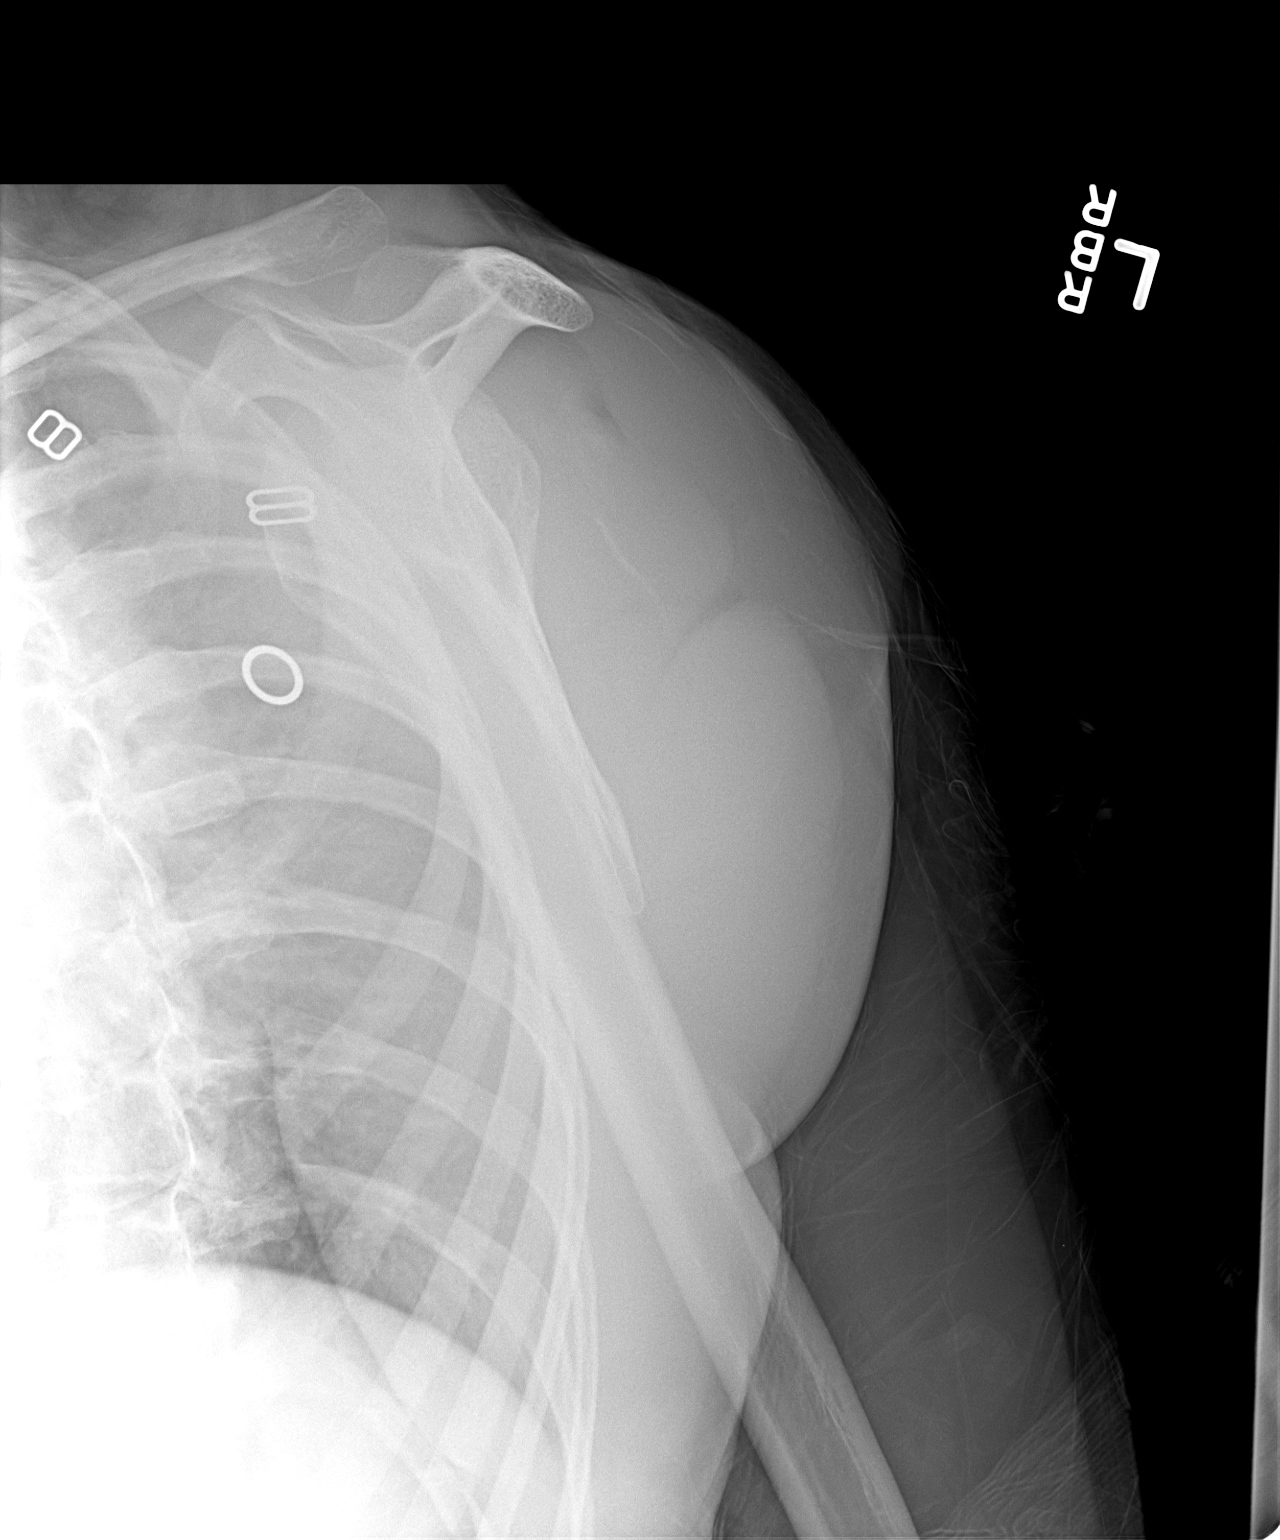

[2 of 2 positions shown; findings below may reference images not displayed]

FINDINGS: Anterior dislocation has been reduced.  No new
abnormality.
IMPRESSION: Successful reduction of dislocation.

## 2015-06-23 ENCOUNTER — Encounter (HOSPITAL_COMMUNITY): Payer: Self-pay | Admitting: *Deleted

## 2015-06-23 ENCOUNTER — Emergency Department (HOSPITAL_COMMUNITY)
Admission: EM | Admit: 2015-06-23 | Discharge: 2015-06-23 | Disposition: A | Discharge status: Home / Self Care | Payer: BLUE CROSS/BLUE SHIELD | Attending: Emergency Medicine | Admitting: Emergency Medicine

## 2015-06-23 ENCOUNTER — Emergency Department (HOSPITAL_COMMUNITY): Payer: BLUE CROSS/BLUE SHIELD

## 2015-06-23 DIAGNOSIS — S42141A Displaced fracture of glenoid cavity of scapula, right shoulder, initial encounter for closed fracture: Secondary | ICD-10-CM | POA: Diagnosis not present

## 2015-06-23 DIAGNOSIS — S43014A Anterior dislocation of right humerus, initial encounter: Secondary | ICD-10-CM | POA: Diagnosis not present

## 2015-06-23 DIAGNOSIS — IMO0001 Reserved for inherently not codable concepts without codable children: Secondary | ICD-10-CM

## 2015-06-23 DIAGNOSIS — S4291XA Fracture of right shoulder girdle, part unspecified, initial encounter for closed fracture: Secondary | ICD-10-CM

## 2015-06-23 DIAGNOSIS — S4991XA Unspecified injury of right shoulder and upper arm, initial encounter: Secondary | ICD-10-CM | POA: Diagnosis present

## 2015-06-23 DIAGNOSIS — Z79899 Other long term (current) drug therapy: Secondary | ICD-10-CM | POA: Diagnosis not present

## 2015-06-23 DIAGNOSIS — Y9389 Activity, other specified: Secondary | ICD-10-CM | POA: Diagnosis not present

## 2015-06-23 DIAGNOSIS — Y92410 Unspecified street and highway as the place of occurrence of the external cause: Secondary | ICD-10-CM | POA: Diagnosis not present

## 2015-06-23 DIAGNOSIS — Y998 Other external cause status: Secondary | ICD-10-CM | POA: Insufficient documentation

## 2015-06-23 MED ORDER — CYCLOBENZAPRINE HCL 10 MG PO TABS: 10.0000 mg | ORAL_TABLET | Freq: Two times a day (BID) | ORAL | Status: AC | PRN

## 2015-06-23 MED ORDER — PROPOFOL 10 MG/ML IV BOLUS
2.0000 mg/kg | Freq: Once | INTRAVENOUS | Status: DC
  Filled 2015-06-23: qty 20

## 2015-06-23 MED ORDER — HYDROMORPHONE HCL 1 MG/ML IJ SOLN
1.0000 mg | Freq: Once | INTRAMUSCULAR | Status: AC
  Administered 2015-06-23: 1 mg via INTRAVENOUS
  Filled 2015-06-23: qty 1

## 2015-06-23 MED ORDER — OXYCODONE HCL 5 MG PO TABS: 5.0000 mg | ORAL_TABLET | ORAL | Status: AC | PRN

## 2015-06-23 MED ORDER — ONDANSETRON HCL 4 MG/2ML IJ SOLN
4.0000 mg | Freq: Once | INTRAMUSCULAR | Status: AC
  Administered 2015-06-23: 4 mg via INTRAVENOUS
  Filled 2015-06-23: qty 2

## 2015-06-23 MED ORDER — FENTANYL CITRATE (PF) 100 MCG/2ML IJ SOLN
50.0000 ug | Freq: Once | INTRAMUSCULAR | Status: AC
  Administered 2015-06-23: 50 ug via INTRAVENOUS
  Filled 2015-06-23: qty 2

## 2015-06-23 MED ORDER — SODIUM CHLORIDE 0.9 % IV BOLUS (SEPSIS)
1000.0000 mL | Freq: Once | INTRAVENOUS | Status: AC
  Administered 2015-06-23: 1000 mL via INTRAVENOUS

## 2015-06-23 MED ORDER — LIDOCAINE HCL (PF) 2 % IJ SOLN
10.0000 mL | Freq: Once | INTRAMUSCULAR | Status: AC
  Administered 2015-06-23: 10 mL via INTRADERMAL

## 2015-06-23 MED ORDER — PROPOFOL 10 MG/ML IV BOLUS
INTRAVENOUS | Status: AC | PRN
  Administered 2015-06-23: 60 mg via INTRAVENOUS

## 2015-06-23 NOTE — Discharge Instructions (Signed)
Dislocation or Subluxation °Dislocation of a joint occurs when ends of two or more adjacent bones no longer touch each other. A subluxation is a minor form of a dislocation, in which two or more adjacent bones are no longer properly aligned. The most common joints susceptible to a dislocation are the shoulder, kneecap, and fingers.  °SYMPTOMS  °· Sudden pain at the time of injury. °· Noticeable deformity in the area of the joint. °· Limited range of motion. °CAUSES  °· Usually a traumatic injury that stretches or tears ligaments that surround a joint and hold the bones together. °· Condition present at birth (congenital) in which the joint surfaces are shallow or abnormally formed. °· Joint disease such as arthritis or other diseases of ligaments and tissues around a joint. °RISK INCREASES WITH: °· Repeated injury to a joint. °· Previous dislocation of a joint. °· Contact sports (football, rugby, hockey, lacrosse) or sports that require repetitive overhead arm motion (throwing, swimming, volleyball). °· Rheumatoid arthritis. °· Congenital joint condition. °PREVENTION °· Warm up and stretch properly before activity. °· Maintain physical fitness: °¨ Joint flexibility. °¨ Muscle strength and endurance. °¨ Cardiovascular fitness. °· Wear proper protective equipment and ensure correct fit. °· Learn and use proper technique. °PROGNOSIS  °This condition is usually curable with prompt treatment. After the dislocation has been put back in place, the joint may require immobilization with a cast, splint, or sling for 2 to 6 weeks, often followed by strength and stretching exercises that may be performed at home or with a therapist. °RELATED COMPLICATIONS  °· Damage to nearby nerves or major blood vessels, causing numbness, coldness, or paleness. °· Recurrent injury to the joint. °· Arthritis of affected joint. °· Fracture of joint. °TREATMENT °Treatment initially involves realigning the bones (reduction) of the joint.  Reductions should only be performed by someone who is trained in the procedure. After the joint is reduced, medicine and ice should be used to reduce pain and inflammation. The joint may be immobilized to allow for the muscles and ligaments to heal. If a joint is subjected to recurrent dislocations, surgery may be necessary to tighten or replace injured structures. After surgery, stretching and strengthening exercises may be required. These may be performed at home or with a therapist. °MEDICATION  °· Patients may require medicine to help them relax (sedative) or muscle relaxants in order to reduce the joint. °· If pain medicine is necessary, nonsteroidal anti-inflammatory medicines, such as aspirin and ibuprofen, or other minor pain relievers, such as acetaminophen, are often recommended. °· Do not take pain medicine for 7 days before surgery. °· Prescription pain relievers may be necessary. Use only as directed and only as much as you need. °SEEK MEDICAL CARE IF:  °· Symptoms get worse or do not improve despite treatment. °· You have difficulty moving a joint after injury. °· Any extremity becomes numb, pale, or cool after injury. This is an emergency. °· Dislocations or subluxations occur repeatedly. °Document Released: 11/25/2005 Document Revised: 02/17/2012 Document Reviewed: 03/09/2009 °[employer]® Patient Information ©2015 [employer]. This information is not intended to replace advice given to you by your health care provider. Make sure you discuss any questions you have with your health care provider. ° °

## 2015-06-23 NOTE — ED Notes (Signed)
Pt stable, ambulatory, states understanding of discharge instructions 

## 2015-06-23 NOTE — Progress Notes (Signed)
Orthopedic Tech Progress Note Patient Details:  Katrina LoraKelsey E Heath 06/18/1991 [id number]  Ortho Devices Type of Ortho Device: Sling immobilizer Ortho Device/Splint Location: RUE Ortho Device/Splint Interventions: Ordered, Application   Jennye MoccasinHughes, [name] 06/23/2015, 9:13 PM

## 2015-06-23 NOTE — ED Notes (Signed)
Dr. Wright at bedside.
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
## 2015-06-23 NOTE — ED Notes (Signed)
Xray tech informed me that patient was unable to lift arm up for xray, from what they could see the patient's arm was clearly dislocated.  Pt screaming in pain in xray, Dr. Delford FieldWright notified, see new orders for dilaudid.

## 2015-06-23 NOTE — ED Notes (Signed)
Consent signed.
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
## 2015-08-15 ENCOUNTER — Other Ambulatory Visit: Payer: Self-pay | Admitting: Family Medicine

## 2015-08-15 DIAGNOSIS — N6310 Unspecified lump in the right breast, unspecified quadrant: Secondary | ICD-10-CM

## 2015-08-17 ENCOUNTER — Ambulatory Visit
Admission: RE | Admit: 2015-08-17 | Discharge: 2015-08-17 | Disposition: A | Discharge status: Home / Self Care | Payer: BLUE CROSS/BLUE SHIELD | Source: Ambulatory Visit | Attending: Family Medicine | Admitting: Family Medicine

## 2015-08-17 DIAGNOSIS — N6310 Unspecified lump in the right breast, unspecified quadrant: Secondary | ICD-10-CM
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
## 2017-12-04 ENCOUNTER — Ambulatory Visit: Payer: Self-pay | Admitting: Nurse Practitioner

## 2017-12-04 VITALS — BP 122/84 | HR 95 | Temp 100.1°F | Resp 18

## 2017-12-04 DIAGNOSIS — J069 Acute upper respiratory infection, unspecified: Secondary | ICD-10-CM

## 2017-12-04 MED ORDER — DEXTROMETHORPHAN HBR 15 MG/5ML PO SYRP: 10.0000 mL | ORAL_SOLUTION | Freq: Four times a day (QID) | ORAL | 0 refills | Status: AC | PRN

## 2017-12-04 NOTE — Patient Instructions (Addendum)
Viral Respiratory Infection A respiratory infection is an illness that affects part of the respiratory system, such as the lungs, nose, or throat. Most respiratory infections are caused by either viruses or bacteria. A respiratory infection that is caused by a virus is called a viral respiratory infection. Common types of viral respiratory infections include:  A cold.  The flu (influenza).  A respiratory syncytial virus (RSV) infection.  How do I know if I have a viral respiratory infection? Most viral respiratory infections cause:  A stuffy or runny nose.  Yellow or green nasal discharge.  A cough.  Sneezing.  Fatigue.  Achy muscles.  A sore throat.  Sweating or chills.  A fever.  A headache.  How are viral respiratory infections treated? If influenza is diagnosed early, it may be treated with an antiviral medicine that shortens the length of time a person has symptoms. Symptoms of viral respiratory infections may be treated with over-the-counter and prescription medicines, such as:  Expectorants. These make it easier to cough up mucus.  Decongestant nasal sprays.  Health care providers do not prescribe antibiotic medicines for viral infections. This is because antibiotics are designed to kill bacteria. They have no effect on viruses. How do I know if I should stay home from work or school? To avoid exposing others to your respiratory infection, stay home if you have:  A fever.  A persistent cough.  A sore throat.  A runny nose.  Sneezing.  Muscles aches.  Headaches.  Fatigue.  Weakness.  Chills.  Sweating.  Nausea.  Follow these instructions at home:  Rest as much as possible.  Take over-the-counter and prescription medicines only as told by your health care provider.  Drink enough fluid to keep your urine clear or pale yellow. This helps prevent dehydration and helps loosen up mucus.  Gargle with a salt-water mixture 3-4 times per day or  as needed. To make a salt-water mixture, completely dissolve -1 tsp of salt in 1 cup of warm water.  Use nose drops made from salt water to ease congestion and soften raw skin around your nose.  Do not drink alcohol.  Do not use tobacco products, including cigarettes, chewing tobacco, and e-cigarettes. If you need help quitting, ask your health care provider.  Take Ibuprofen 800mg  every 8 hours until fever resolves.  Contact a health care provider if:  Your symptoms last for 10 days or longer.  Your symptoms get worse over time.  You have a fever.  You have severe sinus pain in your face or forehead.  The glands in your jaw or neck become very swollen. Get help right away if:  You feel pain or pressure in your chest.  You have shortness of breath.  You faint or feel like you will faint.  You have severe and persistent vomiting.  You feel confused or disoriented. This information is not intended to replace advice given to you by your health care provider. Make sure you discuss any questions you have with your health care provider. Document Released: 09/04/2005 Document Revised: 05/02/2016 Document Reviewed: 05/03/2015 Elsevier Interactive Patient Education  2018 Elsevier Inc.  Cough, Adult Coughing is a reflex that clears your throat and your airways. Coughing helps to heal and protect your lungs. It is normal to cough occasionally, but a cough that happens with other symptoms or lasts a long time may be a sign of a condition that needs treatment. A cough may last only 2-3 weeks (acute), or it may last  longer than 8 weeks (chronic). What are the causes? Coughing is commonly caused by:  Breathing in substances that irritate your lungs.  A viral or bacterial respiratory infection.  Allergies.  Asthma.  Postnasal drip.  Smoking.  Acid backing up from the stomach into the esophagus (gastroesophageal reflux).  Certain medicines.  Chronic lung problems, including  COPD (or rarely, lung cancer).  Other medical conditions such as heart failure.  Follow these instructions at home: Pay attention to any changes in your symptoms. Take these actions to help with your discomfort:  Take medicines only as told by your health care provider. ? If you were prescribed an antibiotic medicine, take it as told by your health care provider. Do not stop taking the antibiotic even if you start to feel better. ? Talk with your health care provider before you take a cough suppressant medicine.  Drink enough fluid to keep your urine clear or pale yellow.  If the air is dry, use a cold steam vaporizer or humidifier in your bedroom or your home to help loosen secretions.  Avoid anything that causes you to cough at work or at home.  If your cough is worse at night, try sleeping in a semi-upright position.  Avoid cigarette smoke. If you smoke, quit smoking. If you need help quitting, ask your health care provider.  Avoid caffeine.  Avoid alcohol.  Rest as needed.  Contact a health care provider if:  You have new symptoms.  You cough up pus.  Your cough does not get better after 2-3 weeks, or your cough gets worse.  You cannot control your cough with suppressant medicines and you are losing sleep.  You develop pain that is getting worse or pain that is not controlled with pain medicines.  You have a fever.  You have unexplained weight loss.  You have night sweats. Get help right away if:  You cough up blood.  You have difficulty breathing.  Your heartbeat is very fast. This information is not intended to replace advice given to you by your health care provider. Make sure you discuss any questions you have with your health care provider. Document Released: 05/24/2011 Document Revised: 05/02/2016 Document Reviewed: 02/01/2015 Elsevier Interactive Patient Education  [name]2018 Elsevier Inc.

## 2017-12-04 NOTE — Progress Notes (Signed)
Subjective:     Katrina Heath is a 26 y.o. female here for evaluation of a cough. Onset of symptoms was 1 day ago. Symptoms have been gradually worsening since that time. The cough is productive of greenish-brown sputum and is aggravated by nothing. Associated symptoms include: fever and headache. Patient does not have a history of asthma. Patient does not have a history of environmental allergens. Patient has traveled recently. Patient does not have a history of smoking.   The following portions of the patient's history were reviewed and updated as appropriate: allergies, current medications and past medical history.  Review of Systems Constitutional: positive for chills and fevers, negative for anorexia and sweats Eyes: negative Ears, nose, mouth, throat, and face: positive for sore throat and "feeling of ear fullness", negative for ear drainage, earaches, hoarseness and nasal congestion Respiratory: positive for cough, negative for asthma, dyspnea on exertion and bronchitis Cardiovascular: negative Neurological: positive for headaches Allergic/Immunologic: negative    Objective:     BP 122/84 (BP Location: Right Arm, Patient Position: Sitting, Cuff Size: Normal)   Pulse 95   Temp 100.1 F (37.8 C) (Oral)   Resp 18   SpO2 98%  General appearance: alert, cooperative and fatigued Head: Normocephalic, without obvious abnormality, atraumatic Eyes: conjunctivae/corneas clear. PERRL, EOM's intact. Fundi benign. Ears: normal TM's and external ear canals both ears and copius cerume in right ear Nose: no discharge, right turbinate red, edematous, no sinus tenderness Throat: lips, mucosa, and tongue normal; teeth and gums normal Lungs: clear to auscultation bilaterally Heart: regular rate and rhythm, S1, S2 normal, no murmur, click, rub or gallop Pulses: 2+ and symmetric Skin: Skin color, texture, turgor normal. No rashes or lesions Lymph nodes: Cervical, supraclavicular, and axillary  nodes normal. and cervical and submandibular nodes normal Neurologic: Grossly normal    Assessment:    Acute Upper Respiratory Infection    Plan:    Explained lack of efficacy of antibiotics in viral disease. Antitussives per medication orders. Avoid exposure to tobacco smoke and fumes. Call if shortness of breath worsens, blood in sputum, change in character of cough, development of fever or chills, inability to maintain nutrition and hydration. Avoid exposure to tobacco smoke and fumes. Follow-up in 3 days, or sooner as needed. Patient instructed to use Ibuprofen 800mg  up to three times daily for fever, pain or general discomfort.  Patient will use humidifier at home.  Increase fluids, rest.  Reviewed indications for patient to go to ER, increasing fever not controlled by analgesics, SOB, difficulty breathing or other concerns.  Patient instructed to call if symptoms do not improve.

## 2017-12-06 ENCOUNTER — Telehealth: Payer: Self-pay

## 2017-12-06 NOTE — Telephone Encounter (Signed)
Patient states she feels much better and she appreciated the call.

## 2017-12-16 ENCOUNTER — Encounter (HOSPITAL_COMMUNITY): Payer: Self-pay

## 2017-12-16 ENCOUNTER — Other Ambulatory Visit: Payer: Self-pay

## 2017-12-16 DIAGNOSIS — R197 Diarrhea, unspecified: Secondary | ICD-10-CM | POA: Diagnosis not present

## 2017-12-16 DIAGNOSIS — Z79899 Other long term (current) drug therapy: Secondary | ICD-10-CM | POA: Diagnosis not present

## 2017-12-16 DIAGNOSIS — R1084 Generalized abdominal pain: Secondary | ICD-10-CM | POA: Insufficient documentation

## 2017-12-16 DIAGNOSIS — R112 Nausea with vomiting, unspecified: Secondary | ICD-10-CM | POA: Diagnosis not present

## 2017-12-16 MED ORDER — ONDANSETRON 4 MG PO TBDP
4.0000 mg | ORAL_TABLET | Freq: Once | ORAL | Status: AC | PRN
  Administered 2017-12-16: 4 mg via ORAL
  Filled 2017-12-16: qty 1

## 2017-12-16 NOTE — ED Triage Notes (Signed)
Pt states that she has had n/v/d all day with lower abd pain. Reports vomited x 23 and diarrhea x 5. Vomiting in triage

## 2017-12-17 ENCOUNTER — Emergency Department (HOSPITAL_COMMUNITY)
Admission: EM | Admit: 2017-12-17 | Discharge: 2017-12-17 | Disposition: A | Discharge status: Home / Self Care | Payer: BLUE CROSS/BLUE SHIELD | Attending: Emergency Medicine | Admitting: Emergency Medicine

## 2017-12-17 DIAGNOSIS — R112 Nausea with vomiting, unspecified: Secondary | ICD-10-CM

## 2017-12-17 DIAGNOSIS — R197 Diarrhea, unspecified: Secondary | ICD-10-CM

## 2017-12-17 LAB — URINALYSIS, ROUTINE W REFLEX MICROSCOPIC
Bacteria, UA: NONE SEEN
Bilirubin Urine: NEGATIVE
Glucose, UA: NEGATIVE mg/dL
Hgb urine dipstick: NEGATIVE
Ketones, ur: 20 mg/dL — AB
Leukocytes, UA: NEGATIVE
Nitrite: NEGATIVE
Protein, ur: 30 mg/dL — AB
Specific Gravity, Urine: 1.03 (ref 1.005–1.030)
pH: 5 (ref 5.0–8.0)

## 2017-12-17 LAB — CBC
HCT: 43.9 % (ref 36.0–46.0)
Hemoglobin: 14.8 g/dL (ref 12.0–15.0)
MCH: 30.8 pg (ref 26.0–34.0)
MCHC: 33.7 g/dL (ref 30.0–36.0)
MCV: 91.5 fL (ref 78.0–100.0)
Platelets: 275 10*3/uL (ref 150–400)
RBC: 4.8 MIL/uL (ref 3.87–5.11)
RDW: 11.6 % (ref 11.5–15.5)
WBC: 12.5 10*3/uL — ABNORMAL HIGH (ref 4.0–10.5)

## 2017-12-17 LAB — I-STAT BETA HCG BLOOD, ED (MC, WL, AP ONLY): I-stat hCG, quantitative: <5 m[IU]/mL (ref <5)

## 2017-12-17 LAB — COMPREHENSIVE METABOLIC PANEL
ALT: 38 U/L (ref 14–54)
AST: 27 U/L (ref 15–41)
Albumin: 4.3 g/dL (ref 3.5–5.0)
Alkaline Phosphatase: 64 U/L (ref 38–126)
Anion gap: 9 (ref 5–15)
BUN: 18 mg/dL (ref 6–20)
CO2: 25 mmol/L (ref 22–32)
Calcium: 9.2 mg/dL (ref 8.9–10.3)
Chloride: 104 mmol/L (ref 101–111)
Creatinine, Ser: 0.76 mg/dL (ref 0.44–1.00)
GFR calc Af Amer: >60 mL/min (ref >60)
GFR calc non Af Amer: >60 mL/min (ref >60)
Glucose, Bld: 135 mg/dL — ABNORMAL HIGH (ref 65–99)
Potassium: 4.4 mmol/L (ref 3.5–5.1)
Sodium: 138 mmol/L (ref 135–145)
Total Bilirubin: 1.2 mg/dL (ref 0.3–1.2)
Total Protein: 7.1 g/dL (ref 6.5–8.1)

## 2017-12-17 LAB — LIPASE, BLOOD: Lipase: 30 U/L (ref 11–51)

## 2017-12-17 MED ORDER — MORPHINE SULFATE (PF) 4 MG/ML IV SOLN
4.0000 mg | Freq: Once | INTRAVENOUS | Status: AC
  Administered 2017-12-17: 4 mg via INTRAVENOUS
  Filled 2017-12-17: qty 1

## 2017-12-17 MED ORDER — ONDANSETRON HCL 4 MG/2ML IJ SOLN
4.0000 mg | Freq: Once | INTRAMUSCULAR | Status: AC
  Administered 2017-12-17: 4 mg via INTRAVENOUS
  Filled 2017-12-17: qty 2

## 2017-12-17 MED ORDER — SODIUM CHLORIDE 0.9 % IV BOLUS (SEPSIS)
1000.0000 mL | Freq: Once | INTRAVENOUS | Status: AC
  Administered 2017-12-17: 1000 mL via INTRAVENOUS

## 2017-12-17 MED ORDER — ONDANSETRON 4 MG PO TBDP
4.0000 mg | ORAL_TABLET | Freq: Once | ORAL | Status: AC | PRN
  Administered 2017-12-17: 4 mg via ORAL
  Filled 2017-12-17: qty 1

## 2017-12-17 MED ORDER — METOCLOPRAMIDE HCL 5 MG/ML IJ SOLN
10.0000 mg | Freq: Once | INTRAMUSCULAR | Status: AC
  Administered 2017-12-17: 10 mg via INTRAVENOUS
  Filled 2017-12-17: qty 2

## 2017-12-17 MED ORDER — ONDANSETRON 4 MG PO TBDP: 4.0000 mg | ORAL_TABLET | Freq: Three times a day (TID) | ORAL | 0 refills | Status: AC | PRN

## 2017-12-17 NOTE — ED Notes (Signed)
Pt tolerated juice.

## 2017-12-17 NOTE — ED Notes (Signed)
Pt c/o continued nausea and abdominal cramping.

## 2017-12-17 NOTE — ED Notes (Addendum)
Pt requesting nausea medication, vomiting

## 2017-12-17 NOTE — Discharge Instructions (Signed)
I suspect you have a viral GI bug.   Stay well hydrated. Use zofran for nausea. Avoid spicy, greasy, heavy meals until you feel better.   Return for fevers, worsening abdominal pain, bloody stools, inability to tolerate fluids by mouth due to nausea/vomiting

## 2017-12-17 NOTE — ED Provider Notes (Signed)
[facility] EMERGENCY DEPARTMENT Provider Note   CSN: [id number] Arrival date & time: 12/16/17  2338     History   Chief Complaint Chief Complaint  Patient presents with  . Emesis    HPI Katrina Heath is a 27 y.o. female w/o pertinent pmh presents for sudden onset nausea with NBNB vomiting, non bloody diarrhea, and diffuse intermittent crampy abdominal pain since yesterday. Denies sick contacts or exposure to different foods, recent travel. No fevers, chills, CP, SOB, dysuria, hematuria, melena, hematochezia. No previous abdominal surgeries or known GI medical conditions. Has been trying to sip on water but unable to hold it down. Last emesis 0600, last BM yesterday.  HPI  Past Medical History:  Diagnosis Date  . Dislocation of left shoulder joint 03/27/2012  . Shoulder dislocation    bilateral    Patient Active Problem List   Diagnosis Date Noted  . Dislocation of left shoulder joint 03/27/2012    Past Surgical History:  Procedure Laterality Date  . ADENOIDECTOMY    . SHOULDER SURGERY     2008  . TYMPANOSTOMY TUBE PLACEMENT      OB History    No data available       Home Medications    Prior to Admission medications   Medication Sig Start Date End Date Taking? Authorizing Provider  ALPRAZolam Prudy Feeler) 0.5 MG tablet Take 0.5 mg by mouth at bedtime as needed for anxiety.   Yes [provider]  amphetamine-dextroamphetamine (ADDERALL XR) 15 MG 24 hr capsule Take 15 mg by mouth daily. 10/02/16  Yes [provider]  calcium-vitamin D (OSCAL WITH D) 250-125 MG-UNIT per tablet Take 1 tablet by mouth daily.   Yes [provider]  citalopram (CELEXA) 20 MG tablet Take 20 mg by mouth daily.   Yes [provider]  fish oil-omega-3 fatty acids 1000 MG capsule Take 2 g by mouth daily.   Yes [provider]  gabapentin (NEURONTIN) 300 MG capsule Take 600 mg by mouth at bedtime.    Yes [provider]    levonorgestrel (MIRENA) 20 MCG/24HR IUD 1 each by Intrauterine route once.   Yes [provider]  Multiple Vitamin (MULTIVITAMIN) tablet Take 1 tablet by mouth daily.   Yes [provider]  pseudoephedrine-guaifenesin (MUCINEX D) 60-600 MG 12 hr tablet Take 1 tablet by mouth daily as needed for congestion.   Yes [provider]  cyclobenzaprine (FLEXERIL) 10 MG tablet Take 1 tablet (10 mg total) by mouth 2 (two) times daily as needed for muscle spasms. Patient not taking: Reported on 12/04/2017 06/23/15   Lenell Antu, MD  ondansetron (ZOFRAN ODT) 4 MG disintegrating tablet Take 1 tablet (4 mg total) by mouth every 8 (eight) hours as needed for nausea or vomiting. 12/17/17   Liberty Handy, PA-C  oxyCODONE (ROXICODONE) 5 MG immediate release tablet Take 1 tablet (5 mg total) by mouth every 4 (four) hours as needed for severe pain. Patient not taking: Reported on 12/04/2017 06/23/15   Lenell Antu, MD  promethazine (PHENERGAN) 25 MG tablet Take 1 tablet (25 mg total) by mouth every 6 (six) hours as needed for nausea. 03/27/12 04/03/12  Teryl Lucy, MD    Family History No family history on file.  Social History Social History   Tobacco Use  . Smoking status: Never Smoker  . Smokeless tobacco: Never Used  Substance Use Topics  . Alcohol use: No  . Drug use: No     Allergies  Food   Review of Systems Review of Systems  Gastrointestinal: Positive for abdominal pain, diarrhea, nausea and vomiting.  All other systems reviewed and are negative.    Physical Exam Updated Vital Signs BP 116/70   Pulse 97   Temp 98.7 F (37.1 C) (Oral)   Resp 16   SpO2 100%   Physical Exam  Constitutional: She is oriented to person, place, and time. She appears well-developed and well-nourished. No distress.  NAD. Non toxic. Looks uncomfortable.   HENT:  Head: Normocephalic and atraumatic.  Right Ear: External ear normal.  Left Ear: External ear normal.   Nose: Nose normal.  Dry mucous membranes  Eyes: Conjunctivae and EOM are normal. No scleral icterus.  Neck: Normal range of motion. Neck supple.  Cardiovascular: Normal rate, regular rhythm and normal heart sounds.  No murmur heard. No tachycardia or hypotension.   Pulmonary/Chest: Effort normal and breath sounds normal. She has no wheezes.  Abdominal: Soft. Bowel sounds are normal. There is tenderness.  Diffuse mild TTP mostly periumbilical   Musculoskeletal: Normal range of motion. She exhibits no deformity.  Neurological: She is alert and oriented to person, place, and time.  Skin: Skin is warm and dry. Capillary refill takes less than 2 seconds.  Psychiatric: She has a normal mood and affect. Her behavior is normal. Judgment and thought content normal.  Nursing note and vitals reviewed.    ED Treatments / Results  Labs (all labs ordered are listed, but only abnormal results are displayed) Labs Reviewed  COMPREHENSIVE METABOLIC PANEL - Abnormal; Notable for the following components:      Result Value   Glucose, Bld 135 (*)    All other components within normal limits  CBC - Abnormal; Notable for the following components:   WBC 12.5 (*)    All other components within normal limits  URINALYSIS, ROUTINE W REFLEX MICROSCOPIC - Abnormal; Notable for the following components:   Color, Urine AMBER (*)    APPearance HAZY (*)    Ketones, ur 20 (*)    Protein, ur 30 (*)    Squamous Epithelial / LPF 0-5 (*)    All other components within normal limits  LIPASE, BLOOD  I-STAT BETA HCG BLOOD, ED ([facility], [facility], [facility] ONLY)    EKG  EKG Interpretation None       Radiology No results found.  Procedures Procedures (including critical care time)  Medications Ordered in ED Medications  ondansetron (ZOFRAN-ODT) disintegrating tablet 4 mg (4 mg Oral Given 12/16/17 2352)  ondansetron (ZOFRAN-ODT) disintegrating tablet 4 mg (4 mg Oral Given 12/17/17 0336)  sodium chloride 0.9 % bolus 1,000 mL  (1,000 mLs Intravenous New Bag/Given 12/17/17 1148)  sodium chloride 0.9 % bolus 1,000 mL (1,000 mLs Intravenous New Bag/Given 12/17/17 1147)  ondansetron (ZOFRAN) injection 4 mg (4 mg Intravenous Given 12/17/17 1148)  morphine 4 MG/ML injection 4 mg (4 mg Intravenous Given 12/17/17 1148)     Initial Impression / Assessment and Plan / ED Course  I have reviewed the triage vital signs and the nursing notes.  Pertinent labs & imaging results that were available during my care of the patient were reviewed by me and considered in my medical decision making (see chart for details).     Vital signs reassuring upon initial evaluation and on repeat exam. No abdominal tenderness, rigidity or rebound appreciated.  No red flag symptoms of n/v or diarrhea including no HA, CP or SOB, bloody diarrhea, coffee ground emesis.  No recent abx  or travel.  No known PUD or previous GIB.  No chronic use of NSAIDs or ETOH.  Likely acute gastroenteritis.  Electrolytes, liver function tests, lipase and CBC are all reassuring today. Doubt intra-abdominal/pelvic emergency causing nausea, vomiting and diarrhea. Patient tolerated food and fluids in the ED without complications. Patient did not have episodes of emesis or diarrhea in the Ed.  Re-evaluation reassuring. Patient will be discharged at this time with zofran, fluids and close f/u with PCP for worsening symptoms.     Final Clinical Impressions(s) / ED Diagnoses   Final diagnoses:  Nausea vomiting and diarrhea    ED Discharge Orders        Ordered    ondansetron (ZOFRAN ODT) 4 MG disintegrating tablet  Every 8 hours PRN     12/17/17 1243       Liberty HandyGibbons, Claudia J, PA-C 12/17/17 1253    Raeford RazorKohut, Stephen, MD 12/17/17 1411

## 2019-12-07 ENCOUNTER — Ambulatory Visit: Payer: BC Managed Care – PPO | Attending: Internal Medicine

## 2019-12-07 DIAGNOSIS — Z20822 Contact with and (suspected) exposure to covid-19: Secondary | ICD-10-CM

## 2019-12-08 LAB — NOVEL CORONAVIRUS, NAA: SARS-CoV-2, NAA: NOT DETECTED

## 2024-12-01 ENCOUNTER — Emergency Department (HOSPITAL_BASED_OUTPATIENT_CLINIC_OR_DEPARTMENT_OTHER)
Admission: EM | Admit: 2024-12-01 | Discharge: 2024-12-01 | Disposition: A | Discharge status: Home / Self Care | Attending: Emergency Medicine | Admitting: Emergency Medicine

## 2024-12-01 ENCOUNTER — Emergency Department (HOSPITAL_BASED_OUTPATIENT_CLINIC_OR_DEPARTMENT_OTHER)

## 2024-12-01 ENCOUNTER — Other Ambulatory Visit: Payer: Self-pay

## 2024-12-01 ENCOUNTER — Emergency Department (HOSPITAL_BASED_OUTPATIENT_CLINIC_OR_DEPARTMENT_OTHER): Admitting: Radiology

## 2024-12-01 ENCOUNTER — Encounter (HOSPITAL_BASED_OUTPATIENT_CLINIC_OR_DEPARTMENT_OTHER): Payer: Self-pay

## 2024-12-01 DIAGNOSIS — M79661 Pain in right lower leg: Secondary | ICD-10-CM | POA: Insufficient documentation

## 2024-12-01 MED ORDER — CELECOXIB 200 MG PO CAPS: 200.0000 mg | ORAL_CAPSULE | Freq: Two times a day (BID) | ORAL | 0 refills | Status: AC

## 2024-12-01 NOTE — ED Notes (Signed)
 DC paperwork given and verbally understood.

## 2024-12-01 NOTE — ED Triage Notes (Signed)
 She c/o right-sided shin splint. She cites much recent running.

## 2024-12-01 NOTE — Discharge Instructions (Signed)
 Your x-ray and ultrasound were negative for any acute findings.  This is most likely muscular however if there is potential stress fracture from running the treatment is to stop running for about 6 weeks.  You may do low impact exercise.  I am discharging you with a medication called Celebrex  for pain relief.  You may also take Tylenol ..  I would advise compression sleeve on the leg as well as ice and rest.  You may follow-up at the orthopedic urgent care or make an appointment with Dr. Reyne. Get help right away if you have severe pain in the right leg out of proportion to your initial pain, swelling, fever, redness, chest pain or shortness of breath.

## 2024-12-01 NOTE — ED Provider Notes (Signed)
 " [place] EMERGENCY DEPARTMENT AT [facility] Provider Note   CSN: [id number] Arrival date & time: 12/01/24  9091     Patient presents with: Leg Pain   Katrina Heath is a 33 y.o. female visiting here from [place] who is an avid athlete.  Patient has been recently running about 16 km a day and presents to the emergency department chief complaint of right calf pain.  Patient reports that she has had shinsplints in the past but feels like this is completely different is having difficulty even bearing weight on the right leg.  She did fly over from [place] just a few days ago but denies any obvious swelling in the foot ankle or or calf.  Pain is worse when she tries to bear weight on the leg.  She feels it over the tibia and medial calf.  No overt bruising.  She has tried rest and ice as well as anti-inflammatories without relief of her symptoms.  She denies chest pain or shortness of breath.    Leg Pain      Prior to Admission medications  Medication Sig Start Date End Date Taking? Authorizing Provider  ALPRAZolam (XANAX) 0.5 MG tablet Take 0.5 mg by mouth at bedtime as needed for anxiety.    [provider]  amphetamine-dextroamphetamine (ADDERALL XR) 15 MG 24 hr capsule Take 15 mg by mouth daily. 10/02/16   [provider]  calcium-vitamin D (OSCAL WITH D) 250-125 MG-UNIT per tablet Take 1 tablet by mouth daily.    [provider]  citalopram (CELEXA) 20 MG tablet Take 20 mg by mouth daily.    [provider]  cyclobenzaprine  (FLEXERIL ) 10 MG tablet Take 1 tablet (10 mg total) by mouth 2 (two) times daily as needed for muscle spasms. Patient not taking: Reported on 12/04/2017 06/23/15   Brien Pierce, MD  fish oil-omega-3 fatty acids 1000 MG capsule Take 2 g by mouth daily.    [provider]  gabapentin (NEURONTIN) 300 MG capsule Take 600 mg by mouth at bedtime.     [provider]  levonorgestrel (MIRENA) 20  MCG/24HR IUD 1 each by Intrauterine route once.    [provider]  Multiple Vitamin (MULTIVITAMIN) tablet Take 1 tablet by mouth daily.    [provider]  ondansetron  (ZOFRAN  ODT) 4 MG disintegrating tablet Take 1 tablet (4 mg total) by mouth every 8 (eight) hours as needed for nausea or vomiting. 12/17/17   Gibbons, Claudia J, PA-C  oxyCODONE  (ROXICODONE ) 5 MG immediate release tablet Take 1 tablet (5 mg total) by mouth every 4 (four) hours as needed for severe pain. Patient not taking: Reported on 12/04/2017 06/23/15   Brien Pierce, MD  promethazine  (PHENERGAN ) 25 MG tablet Take 1 tablet (25 mg total) by mouth every 6 (six) hours as needed for nausea. 03/27/12 04/03/12  Josefina Chew, MD  pseudoephedrine-guaifenesin (MUCINEX D) 60-600 MG 12 hr tablet Take 1 tablet by mouth daily as needed for congestion.    [provider]    Allergies: Food    Review of Systems  Updated Vital Signs BP 110/74 (BP Location: Right Arm)   Pulse 83   Temp 98.5 F (36.9 C) (Oral)   Resp 14   LMP 11/10/2024 (Exact Date)   SpO2 100%   Physical Exam Vitals and nursing note reviewed.  Constitutional:      General: She is not in acute distress.    Appearance: She is well-developed. She is not diaphoretic.  HENT:     Head: Normocephalic and atraumatic.     Right Ear: External ear normal.     Left Ear: External ear normal.     Nose: Nose normal.     Mouth/Throat:     Mouth: Mucous membranes are moist.  Eyes:     General: No scleral icterus.    Conjunctiva/sclera: Conjunctivae normal.  Cardiovascular:     Rate and Rhythm: Normal rate and regular rhythm.     Heart sounds: Normal heart sounds. No murmur heard.    No friction rub. No gallop.  Pulmonary:     Effort: Pulmonary effort is normal. No respiratory distress.     Breath sounds: Normal breath sounds.  Abdominal:     General: Bowel sounds are normal. There is no distension.     Palpations: Abdomen is soft. There is no  mass.     Tenderness: There is no abdominal tenderness. There is no guarding.  Musculoskeletal:     Cervical back: Normal range of motion.     Comments: Ttp over the R tibia and the adjacent medial calf mm. NVI. Normal ipsliateral ankle and knee exam  Skin:    General: Skin is warm and dry.  Neurological:     Mental Status: She is alert and oriented to person, place, and time.  Psychiatric:        Behavior: Behavior normal.     (all labs ordered are listed, but only abnormal results are displayed) Labs Reviewed - No data to display  EKG: None  Radiology: No results found.   Procedures   Medications Ordered in the ED - No data to display                                  Medical Decision Making Amount and/or Complexity of Data Reviewed Radiology: ordered.  Risk Prescription drug management.   33 year old female who presents emergency department chief complaint of right calf pain.  Differential diagnosis includes stress fracture, delayed onset muscle soreness or shinsplints, Achilles tendinopathy, chronic exertional compartment syndrome, ruptured Baker's cyst.  On physical examination tenderness is predominantly in the calf muscle.  No point tenderness over the tibial surface.  I ordered visualized and interpreted an ultrasound of the right lower extremity as well as an x-ray of the right tib-fib which were both negative for acute finding. But this is likely muscular however I discussed treatment long-term for stress fracture from running which is to stop running for about 6 weeks rest ice and use only low impact athletics such as swimming or biking.  Patient be discharged with crutches for ambulatory support, supportive care, RICE, Celebrex , outpatient follow-up with Ortho and strict return precautions.     Final diagnoses:  None    ED Discharge Orders     None          Arloa Chroman, PA-C 12/01/24 1053    Levander Houston, MD 12/01/24 1501  "
